# Patient Record
Sex: Female | Born: 1995 | Race: White | Hispanic: No | Marital: Single
Health system: Southern US, Community
[De-identification: ages and names within clinical notes are randomized; demographics above are authoritative.]

## PROBLEM LIST (undated history)

## (undated) DIAGNOSIS — H539 Unspecified visual disturbance: Secondary | ICD-10-CM

## (undated) DIAGNOSIS — D649 Anemia, unspecified: Secondary | ICD-10-CM

## (undated) DIAGNOSIS — R55 Syncope and collapse: Secondary | ICD-10-CM

## (undated) HISTORY — DX: Syncope and collapse: R55

## (undated) HISTORY — DX: Unspecified visual disturbance: H53.9

## (undated) HISTORY — PX: WISDOM TOOTH EXTRACTION: SHX21

---
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
## 2016-12-02 ENCOUNTER — Encounter (HOSPITAL_COMMUNITY): Payer: Self-pay

## 2016-12-02 ENCOUNTER — Emergency Department (HOSPITAL_COMMUNITY)
Admission: EM | Admit: 2016-12-02 | Discharge: 2016-12-02 | Disposition: A | Discharge status: Home / Self Care | Payer: Self-pay | Attending: Emergency Medicine | Admitting: Emergency Medicine

## 2016-12-02 DIAGNOSIS — R110 Nausea: Secondary | ICD-10-CM | POA: Insufficient documentation

## 2016-12-02 LAB — CBC
HCT: 35.9 % — ABNORMAL LOW (ref 36.0–46.0)
Hemoglobin: 11.7 g/dL — ABNORMAL LOW (ref 12.0–15.0)
MCH: 30.8 pg (ref 26.0–34.0)
MCHC: 32.6 g/dL (ref 30.0–36.0)
MCV: 94.5 fL (ref 78.0–100.0)
Platelets: 180 10*3/uL (ref 150–400)
RBC: 3.8 MIL/uL — ABNORMAL LOW (ref 3.87–5.11)
RDW: 12.2 % (ref 11.5–15.5)
WBC: 4.6 10*3/uL (ref 4.0–10.5)

## 2016-12-02 LAB — URINALYSIS, ROUTINE W REFLEX MICROSCOPIC
BILIRUBIN URINE: NEGATIVE
Glucose, UA: NEGATIVE mg/dL
Ketones, ur: NEGATIVE mg/dL
NITRITE: NEGATIVE
PH: 7.5 (ref 5.0–8.0)
Protein, ur: 30 mg/dL — AB
Specific Gravity, Urine: 1.025 (ref 1.005–1.030)

## 2016-12-02 LAB — URINALYSIS, MICROSCOPIC (REFLEX)

## 2016-12-02 LAB — COMPREHENSIVE METABOLIC PANEL
ALBUMIN: 4.7 g/dL (ref 3.5–5.0)
ALT: 14 U/L (ref 14–54)
ANION GAP: 8 (ref 5–15)
AST: 23 U/L (ref 15–41)
Alkaline Phosphatase: 49 U/L (ref 38–126)
BUN: 10 mg/dL (ref 6–20)
CO2: 23 mmol/L (ref 22–32)
Calcium: 9.6 mg/dL (ref 8.9–10.3)
Chloride: 106 mmol/L (ref 101–111)
Creatinine, Ser: 0.65 mg/dL (ref 0.44–1.00)
GFR calc Af Amer: >60 mL/min (ref >60)
GLUCOSE: 124 mg/dL — AB (ref 65–99)
POTASSIUM: 3.9 mmol/L (ref 3.5–5.1)
Sodium: 137 mmol/L (ref 135–145)
TOTAL PROTEIN: 7.4 g/dL (ref 6.5–8.1)
Total Bilirubin: 0.6 mg/dL (ref 0.3–1.2)

## 2016-12-02 LAB — I-STAT BETA HCG BLOOD, ED (MC, WL, AP ONLY): I-stat hCG, quantitative: <5 m[IU]/mL (ref <5)

## 2016-12-02 LAB — LIPASE, BLOOD: Lipase: 34 U/L (ref 11–51)

## 2016-12-02 NOTE — ED Notes (Signed)
According to previous nurse first, this pt left. 

## 2016-12-02 NOTE — ED Triage Notes (Signed)
Pt endorses bilateral upper abd pain x 2 days with nausea and states that this morning both arms went numb then went away. VSS.

## 2016-12-02 NOTE — ED Notes (Addendum)
Called for pt. This tech knew where the patient has been sitting in the waiting room. The people sitting next to her told me she and her family left.

## 2018-02-08 ENCOUNTER — Encounter (HOSPITAL_COMMUNITY): Payer: Self-pay | Admitting: *Deleted

## 2018-02-08 ENCOUNTER — Emergency Department (HOSPITAL_COMMUNITY)
Admission: EM | Admit: 2018-02-08 | Discharge: 2018-02-08 | Disposition: A | Discharge status: Home / Self Care | Payer: BLUE CROSS/BLUE SHIELD | Attending: Emergency Medicine | Admitting: Emergency Medicine

## 2018-02-08 ENCOUNTER — Other Ambulatory Visit: Payer: Self-pay

## 2018-02-08 ENCOUNTER — Emergency Department (HOSPITAL_COMMUNITY): Payer: BLUE CROSS/BLUE SHIELD

## 2018-02-08 DIAGNOSIS — Z79899 Other long term (current) drug therapy: Secondary | ICD-10-CM | POA: Diagnosis not present

## 2018-02-08 DIAGNOSIS — Y939 Activity, unspecified: Secondary | ICD-10-CM | POA: Insufficient documentation

## 2018-02-08 DIAGNOSIS — W19XXXA Unspecified fall, initial encounter: Secondary | ICD-10-CM | POA: Diagnosis not present

## 2018-02-08 DIAGNOSIS — R55 Syncope and collapse: Secondary | ICD-10-CM | POA: Diagnosis present

## 2018-02-08 DIAGNOSIS — Y999 Unspecified external cause status: Secondary | ICD-10-CM | POA: Diagnosis not present

## 2018-02-08 DIAGNOSIS — Z7722 Contact with and (suspected) exposure to environmental tobacco smoke (acute) (chronic): Secondary | ICD-10-CM | POA: Insufficient documentation

## 2018-02-08 DIAGNOSIS — Y92219 Unspecified school as the place of occurrence of the external cause: Secondary | ICD-10-CM | POA: Insufficient documentation

## 2018-02-08 DIAGNOSIS — S0990XA Unspecified injury of head, initial encounter: Secondary | ICD-10-CM | POA: Insufficient documentation

## 2018-02-08 LAB — COMPREHENSIVE METABOLIC PANEL
ALK PHOS: 47 U/L (ref 38–126)
ALT: 12 U/L (ref 0–44)
AST: 21 U/L (ref 15–41)
Albumin: 4.5 g/dL (ref 3.5–5.0)
Anion gap: 4 — ABNORMAL LOW (ref 5–15)
BUN: 14 mg/dL (ref 6–20)
CALCIUM: 9.5 mg/dL (ref 8.9–10.3)
CO2: 26 mmol/L (ref 22–32)
Chloride: 107 mmol/L (ref 98–111)
Creatinine, Ser: 0.63 mg/dL (ref 0.44–1.00)
GFR calc non Af Amer: >60 mL/min (ref >60)
Glucose, Bld: 99 mg/dL (ref 70–99)
Potassium: 4 mmol/L (ref 3.5–5.1)
SODIUM: 137 mmol/L (ref 135–145)
Total Bilirubin: 0.8 mg/dL (ref 0.3–1.2)
Total Protein: 7 g/dL (ref 6.5–8.1)

## 2018-02-08 LAB — CBC
HCT: 37.4 % (ref 36.0–46.0)
Hemoglobin: 11.5 g/dL — ABNORMAL LOW (ref 12.0–15.0)
MCH: 30 pg (ref 26.0–34.0)
MCHC: 30.7 g/dL (ref 30.0–36.0)
MCV: 97.7 fL (ref 80.0–100.0)
Platelets: 207 10*3/uL (ref 150–400)
RBC: 3.83 MIL/uL — AB (ref 3.87–5.11)
RDW: 11.4 % — ABNORMAL LOW (ref 11.5–15.5)
WBC: 4 10*3/uL (ref 4.0–10.5)
nRBC: 0 % (ref 0.0–0.2)

## 2018-02-08 LAB — I-STAT BETA HCG BLOOD, ED (MC, WL, AP ONLY)

## 2018-02-08 NOTE — ED Notes (Signed)
Pt ambulated to and from bathroom to void, gait steady, pt denies dizziness; tolerated well.

## 2018-02-08 NOTE — ED Triage Notes (Signed)
Pt reports fall on Monday and hit the back of head and has head aches since the fall. Pt texted  Mother last night  To report she woke up from sleep with stiff arms , eyes twitching and mouth clenched. Pt denies past Hx of seizures.

## 2018-02-08 NOTE — ED Provider Notes (Signed)
[facility]   CSN: [id number] Arrival date & time: 02/08/18  0935     History   Chief Complaint Chief Complaint  Patient presents with  . Head Injury    HPI Peggy Le is a 22 y.o. female.  HPI  Patient is a 22 year old female with a history of syncopal episodes, headaches, and anemia presenting for head injury.  Patient reports that 3 days ago, she was at school, and was standing during an activity where they were ambulatory, and standing in a circle, when she experienced a syncopal episode.  Patient reports that her syncopal episodes are all the same where she experiences sensation of flushing, blurred vision, and she can feel this coming on.  Patient reports she is usually able to sit down and relax and the feeling will pass, however 3 days ago, she experienced syncope.  She was not medically evaluated at the time.  Patient reports that she did sustain a head injury, but denies any neck pain.  Patient denies any weakness or numbness in extremities since then vomiting, visual disturbance, or difficulty with ambulation.  She does report that she and her family became concerned when she had an episode of "seizure-like activity" overnight last night when she felt that both of her upper extremities were locked in flexion, and her eyes were "twitching".  Patient reports that she was fully conscious during this time.  She denies any tongue biting or loss of urinary continence.  Denies any state of confusion afterwards.  Patient reports she was able to text her mother afterwards.  Patient denies any history of seizures.  Patient denies any illicit drug use, alcohol use, or tobacco use.  Patient reports she was previously evaluated by pediatric cardiology, and was found to have no significant abnormalities causing syncope.  Past Medical History:  Diagnosis Date  . Anemia     Patient Active Problem List   Diagnosis Date Noted  .  Autonomic dysfunction 06/25/2014  . Vasovagal syncope 06/25/2014  . Tension headache 06/25/2014  . History of concussion 06/25/2014  . Migraine without aura and without status migrainosus, not intractable 06/25/2014    Past Surgical History:  Procedure Laterality Date  . MOUTH SURGERY  2015   Wisdom Teeth Removed     OB History   None      Home Medications    Prior to Admission medications   Medication Sig Start Date End Date Taking? Authorizing Provider  ibuprofen (ADVIL,MOTRIN) 200 MG tablet Take 400 mg by mouth every 6 (six) hours as needed for fever or moderate pain.    Yes [provider]  Levonorgestrel (KYLEENA) 19.5 MG IUD Place 1 each vaginally once. 03/2015   Yes [provider]  lansoprazole (PREVACID) 15 MG capsule Take 1 capsule (15 mg total) by mouth daily at 12 noon. Patient not taking: Reported on 02/08/2018 07/04/14   Marcellina MillinGaley, Timothy, MD    Family History Family History  Problem Relation Age of Onset  . Depression Father   . Seizures Father        Hx of szs -resolved  . ADD / ADHD Brother   . Other Maternal Grandfather        Brain Disease of unknown etiology  . Stroke Maternal Grandfather   . Cancer Paternal Grandfather     Social History Social History   Tobacco Use  . Smoking status: Passive Smoke Exposure - Never Smoker  . Smokeless tobacco: Never Used  Substance Use Topics  . Alcohol use: No  . Drug use: No     Allergies   Other and Pineapple   Review of Systems Review of Systems  Constitutional: Negative for chills and fever.  HENT: Negative for congestion and sore throat.   Eyes: Negative for visual disturbance.  Respiratory: Negative for cough, chest tightness and shortness of breath.   Cardiovascular: Positive for palpitations. Negative for chest pain and leg swelling.  Gastrointestinal: Negative for abdominal pain, nausea and vomiting.  Genitourinary: Negative for dysuria and flank pain.  Musculoskeletal:  Negative for back pain and myalgias.  Skin: Negative for rash.  Neurological: Positive for tremors, syncope and headaches. Negative for dizziness and light-headedness.     Physical Exam Updated Vital Signs BP 119/74 (BP Location: Right Arm)   Pulse 84   Temp 98.5 F (36.9 C) (Oral)   Resp 16   Ht 5\' 5"  (1.651 m)   Wt 52.2 kg   SpO2 100%   BMI 19.14 kg/m   Physical Exam  Constitutional: She is oriented to person, place, and time. She appears well-developed and well-nourished. No distress.  HENT:  Head: Normocephalic and atraumatic.  Mouth/Throat: Oropharynx is clear and moist.  Eyes: Pupils are equal, round, and reactive to light. Conjunctivae and EOM are normal.  Neck: Normal range of motion. Neck supple.  Cardiovascular: Normal rate, regular rhythm, S1 normal and S2 normal.  No murmur heard. Pulmonary/Chest: Effort normal and breath sounds normal. She has no wheezes. She has no rales.  Abdominal: Soft. She exhibits no distension. There is no tenderness. There is no guarding.  Musculoskeletal: Normal range of motion. She exhibits no edema or deformity.  Neurological: She is alert and oriented to person, place, and time.  Mental Status:  Alert, oriented, thought content appropriate, able to give a coherent history. Speech fluent without evidence of aphasia. Able to follow 2 step commands without difficulty.  Cranial Nerves:  II:  Peripheral visual fields grossly normal, pupils equal, round, reactive to light III,IV, VI: ptosis not present, extra-ocular motions intact bilaterally  V,VII: smile symmetric, facial light touch sensation equal VIII: hearing grossly normal to voice  X: uvula elevates symmetrically  XI: bilateral shoulder shrug symmetric and strong XII: midline tongue extension without fassiculations Motor:  Normal tone. 5/5 in upper and lower extremities bilaterally including strong and equal grip strength and dorsiflexion/plantar flexion Sensory: Pinprick and  light touch normal in all extremities.  Deep Tendon Reflexes: 2+ and symmetric in the biceps and patella. No clonus. Cerebellar: normal finger-to-nose with bilateral upper extremities Gait: normal gait and balance Stance: No pronator drift and good coordination, strength, and position sense with tapping of bilateral arms (performed in sitting position). CV: distal pulses palpable throughout   Skin: Skin is warm and dry. No rash noted. No erythema.  Psychiatric: She has a normal mood and affect. Her behavior is normal. Judgment and thought content normal.  Nursing note and vitals reviewed.    ED Treatments / Results  Labs (all labs ordered are listed, but only abnormal results are displayed) Labs Reviewed  CBC - Abnormal; Notable for the following components:      Result Value   RBC 3.83 (*)    Hemoglobin 11.5 (*)    RDW 11.4 (*)    All other components within normal limits  COMPREHENSIVE METABOLIC PANEL  I-STAT BETA HCG BLOOD, ED ([facility], [facility], [facility] ONLY)    EKG EKG Interpretation  Date/Time:  Thursday February 08 2018 10:26:34 EST  Ventricular Rate:  69 PR Interval:  142 QRS Duration: 86 QT Interval:  370 QTC Calculation: 396 R Axis:   89 Text Interpretation:  Normal sinus rhythm with sinus arrhythmia no acute ST/T changes no significant change since 2015 Confirmed by Pricilla Loveless [phone]) on 02/08/2018 11:36:04 AM   Radiology No results found.  Procedures Procedures (including critical care time)  Medications Ordered in ED Medications - No data to display   Initial Impression / Assessment and Plan / ED Course  I have reviewed the triage vital signs and the nursing notes.  Pertinent labs & imaging results that were available during my care of the patient were reviewed by me and considered in my medical decision making (see chart for details).    Patient is nontoxic-appearing and hemodynamically stable.  Syncope remote 3 days ago, however it appears that the episodes  are recurring for many years, and are occurring with increasing frequency.  Patient is neurologically intact today.  CT without any evidence of intracranial abnormality, or intracranial tumor.  Lab work without any electrolyte abnormalities.  Hemoglobin is 11.5, stable.  EKG in normal sinus rhythm with normal intervals without evidence of ischemia, pressure, or arrhythmia.  Patient's episode of right upper extremity muscular clenching that occurred last night does not appear to be consistent with epileptic seizure, neither tonic-clonic nor focal partial.  No electrolyte derangement.  Will proceed with referral to cardiology and neurology.  I discussed with the patient that I recommend she not drive or operate any kind of heavy machinery or engage in any activities during which he said loss of consciousness could result in injury until she is further evaluated.  On chart review, appears that patient was evaluated by pediatric cardi[facility] at [facility] in 2016.  Patient has patent foramen ovale, however it is felt to be noncontributory to her symptoms.  Patient symptoms were felt to be due to autonomic instability at that time.  Final Clinical Impressions(s) / ED Diagnoses   Final diagnoses:  Minor head injury, initial encounter  Syncope, unspecified syncope type    ED Discharge Orders         Ordered    Ambulatory referral to Cardiology     02/08/18 1233    Ambulatory referral to Neurology    Comments:  An appointment is requested in approximately: 4 weeks   02/08/18 1233           Delia Chimes 02/08/18 1316    Pricilla Loveless, MD 02/08/18 1354

## 2018-02-08 NOTE — Discharge Instructions (Addendum)
Please see the information and instructions below regarding your visit.  Your diagnoses today include:  1. Minor head injury, initial encounter   2. Syncope, unspecified syncope type      Tests performed today include: See side panel of your discharge paperwork for testing performed today. Vital signs are listed at the bottom of these instructions.   Fortunately, your workup today including labs are within normal limits.  Medications prescribed:    Take any prescribed medications only as prescribed, and any over the counter medications only as directed on the packaging.   Home care instructions:  Please follow any educational materials contained in this packet.   It is very important that you change your lifestyle in order to protect yourself and others from harm in the event that you have another passing out spell.   This includes:  No driving for 6 months after your most recent episode, and you must be cleared by a specialist or primary care before resuming driving after this interval.  If you work in a job where you operate heavy machinery (e.g. Fork lifts), or any duty such that a sudden loss of consciousness could lead serious harm to yourself or others, you must cease that activity until cleared by a neurologist. Please ask your healthcare provider to provide any documentation necessary for your employer. Do not swim unsupervised or be around any body of water unsupervised (including bathtubs).  Do not climb ladders. Do not stay locked in enclosed spaces where no one could access you if you needed help. This includes bedrooms and bathrooms.  Assess all activity for safety before participating if a sudden loss of consciousness could lead to harm to self or others.  Follow-up instructions:  Please follow up with your neurologist or establish care with the neurologist listed on your paperwork.   Return instructions:  Please return to the Emergency Department if you experience  worsening symptoms.  Return to the emergency department if you have any further seizures, develop any weakness/numbness of any arm/leg, confusion, slurred speech, or sudden/severe headache. Please return if you have any other emergent concerns.  Additional Information:   Your vital signs today were: BP 119/74 (BP Location: Right Arm)    Pulse 84    Temp 98.5 F (36.9 C) (Oral)    Resp 16    Ht 5\' 5"  (1.651 m)    Wt 52.2 kg    SpO2 100%    BMI 19.14 kg/m  If your blood pressure (BP) was elevated on multiple readings during this visit above 130 for the top number or above 80 for the bottom number, please have this repeated by your primary care provider within one month. --------------  Thank you for allowing us to participate in your care today.

## 2018-04-13 ENCOUNTER — Other Ambulatory Visit: Payer: Self-pay

## 2018-04-13 ENCOUNTER — Encounter: Payer: Self-pay | Admitting: Diagnostic Neuroimaging

## 2018-04-13 ENCOUNTER — Ambulatory Visit (INDEPENDENT_AMBULATORY_CARE_PROVIDER_SITE_OTHER): Payer: BLUE CROSS/BLUE SHIELD | Admitting: Diagnostic Neuroimaging

## 2018-04-13 VITALS — BP 117/68 | HR 58 | Resp 14 | Ht 65.0 in | Wt 113.4 lb

## 2018-04-13 DIAGNOSIS — R55 Syncope and collapse: Secondary | ICD-10-CM

## 2018-04-13 DIAGNOSIS — R259 Unspecified abnormal involuntary movements: Secondary | ICD-10-CM | POA: Diagnosis not present
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
## 2018-04-13 NOTE — Patient Instructions (Signed)
-   MRI brain  - EEG  - According to [place] law, you can not drive unless you are seizure / syncope free for at least 6 months and under physician's care.   - Please maintain precautions. Do not participate in activities where a loss of awareness could harm you or someone else. No swimming alone, no tub bathing, no hot tubs, no driving, no operating motorized vehicles (cars, ATVs, motocycles, etc), lawnmowers, power tools or firearms. No standing at heights, such as rooftops, ladders or stairs. Avoid hot objects such as stoves, heaters, open fires. Wear a helmet when riding a bicycle, scooter, skateboard, etc. and avoid areas of traffic. Set your water heater to 120 degrees or less.

## 2018-04-16 ENCOUNTER — Encounter: Payer: Self-pay | Admitting: Diagnostic Neuroimaging

## 2018-04-16 ENCOUNTER — Telehealth: Payer: Self-pay | Admitting: Diagnostic Neuroimaging

## 2018-04-16 NOTE — Telephone Encounter (Signed)
[employer] Auth: [id number] (exp. 04/16/18 to 05/15/18)/medicaid order sent to [facility] unable to leave vmail mail box is full

## 2018-05-09 ENCOUNTER — Other Ambulatory Visit: Payer: BLUE CROSS/BLUE SHIELD

## 2018-07-05 ENCOUNTER — Telehealth: Payer: Self-pay | Admitting: *Deleted

## 2018-07-05 NOTE — Telephone Encounter (Signed)
Attempted to reach patient to reschedule FU sooner and to discuss video visit. No answer, VMB full.

## 2018-07-23 NOTE — Telephone Encounter (Signed)
Unable to reach patient on 3rd attempt; called mother,Jennifer on DPR  To advise due to current COVID 19 pandemic, our office is severely reducing in person visits in order to minimize the risk to our patients and healthcare providers. We recommend to convert her daughter's appointment to a video visit. She stated her daughter lost her job, is having financial difficulties because of this. She stated she will have her call to either reschedule to a video visit or reschedule FU to a later date. Mom verbalized understanding, appreciation.

## 2018-08-03 ENCOUNTER — Ambulatory Visit: Payer: BLUE CROSS/BLUE SHIELD | Admitting: Diagnostic Neuroimaging

## 2020-11-01 ENCOUNTER — Ambulatory Visit (HOSPITAL_COMMUNITY): Payer: BLUE CROSS/BLUE SHIELD
